# Patient Record
Sex: Female | Born: 1975 | Race: White | Hispanic: No | Marital: Single | Smoking: Current every day smoker
Health system: Southern US, Community
[De-identification: ages and names within clinical notes are randomized; demographics above are authoritative.]

## PROBLEM LIST (undated history)

## (undated) HISTORY — PX: THYROID CYST EXCISION: SHX2511

---

## 2005-11-12 ENCOUNTER — Inpatient Hospital Stay: Payer: Self-pay

## 2005-11-24 ENCOUNTER — Emergency Department: Payer: Self-pay | Admitting: Internal Medicine

## 2006-01-31 ENCOUNTER — Ambulatory Visit: Payer: Self-pay | Admitting: General Surgery

## 2006-12-03 ENCOUNTER — Emergency Department: Payer: Self-pay | Admitting: General Practice

## 2006-12-15 IMAGING — CT CT ABD-PELV W/ CM
2 of 3 series · 15 of 42 positions shown, 19 images · non-contrast
Comparison: none

REASON FOR EXAM: Abdominal pain
COMMENTS:  LMP: One week ago

[Series 2: appendicitis · axial · 0.61mm/px · z∈[+662,+1032]mm · 12 of 140 slices shown, 16 images]
[im 11/140  soft-tissue]
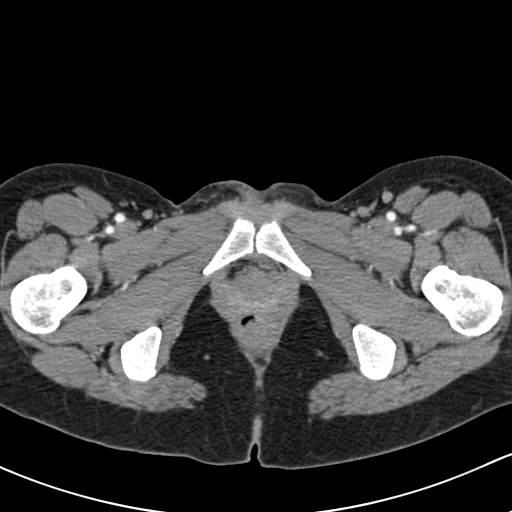
[im 11/140  bone]
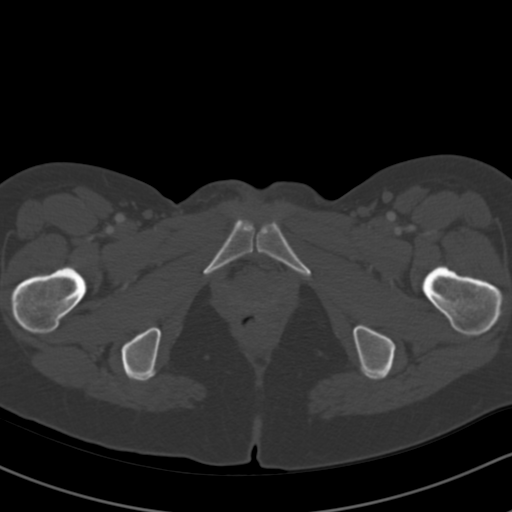
[im 22/140  soft-tissue]
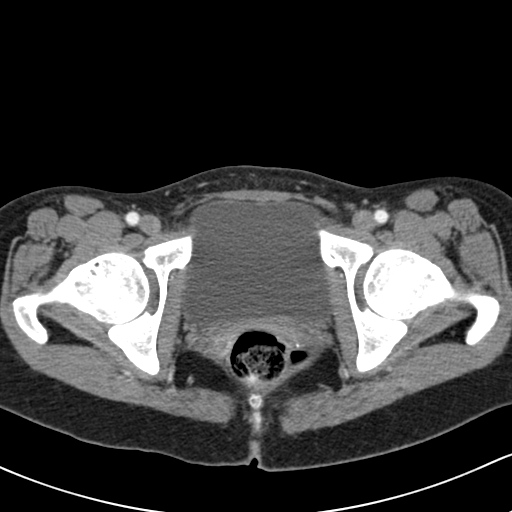
[im 38/140  soft-tissue]
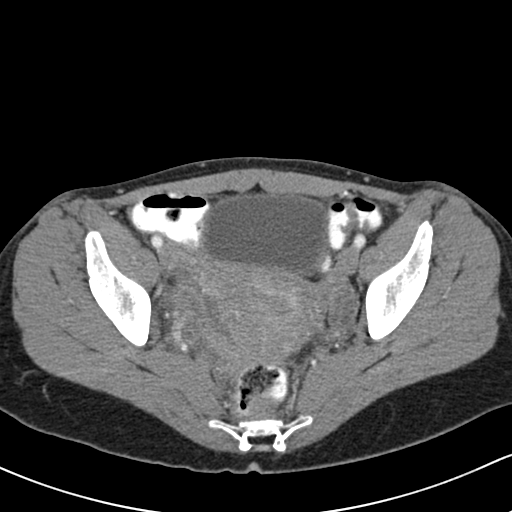
[im 49/140  soft-tissue]
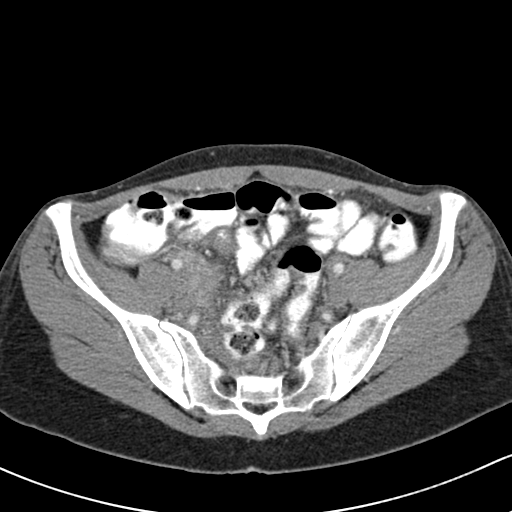
[im 65/140  soft-tissue]
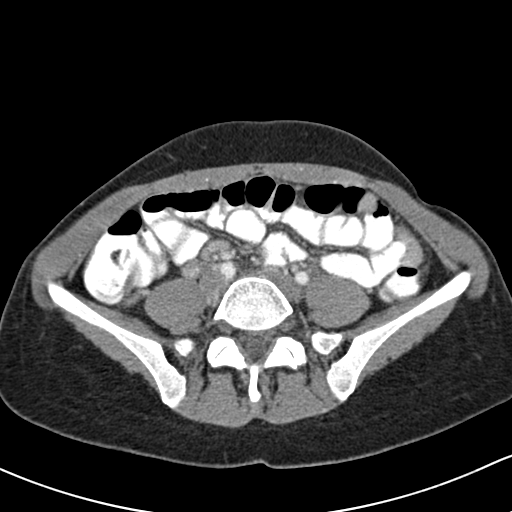
[im 75/140  soft-tissue]
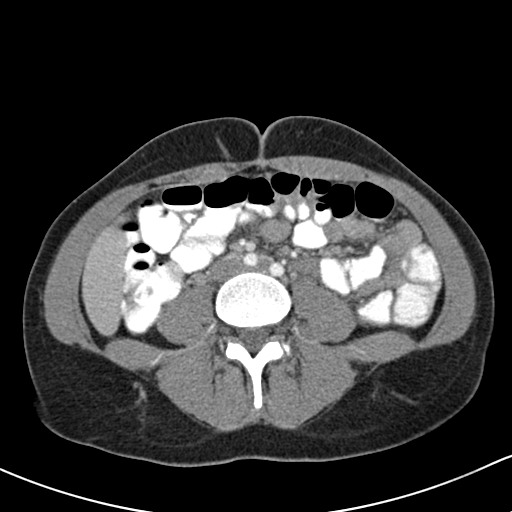
[im 91/140  soft-tissue]
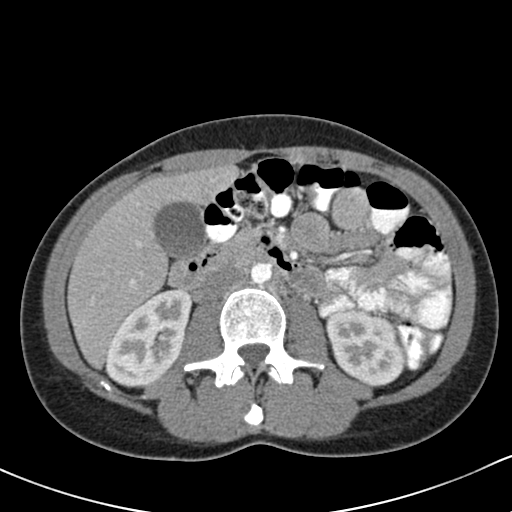
[im 102/140  soft-tissue]
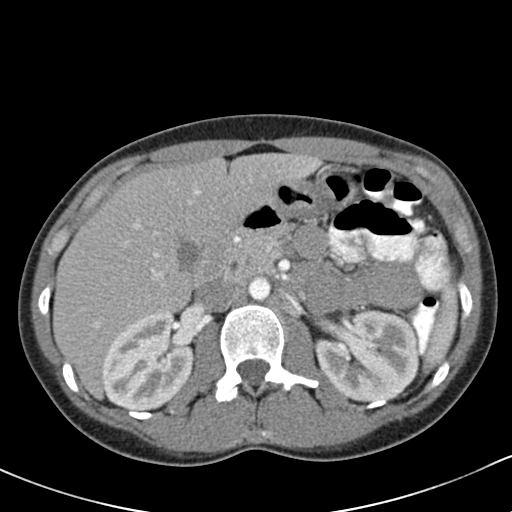
[im 118/140  soft-tissue]
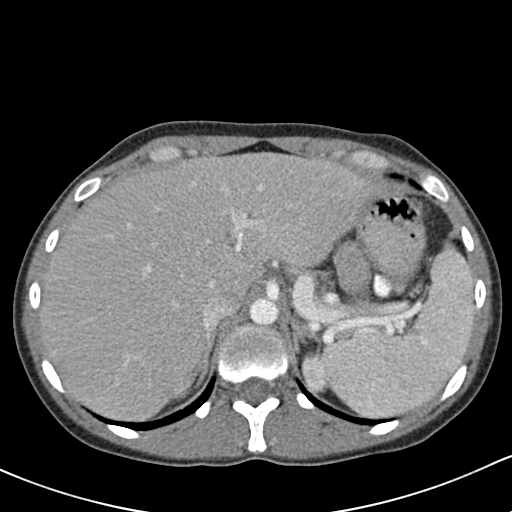
[im 118/140  lung]
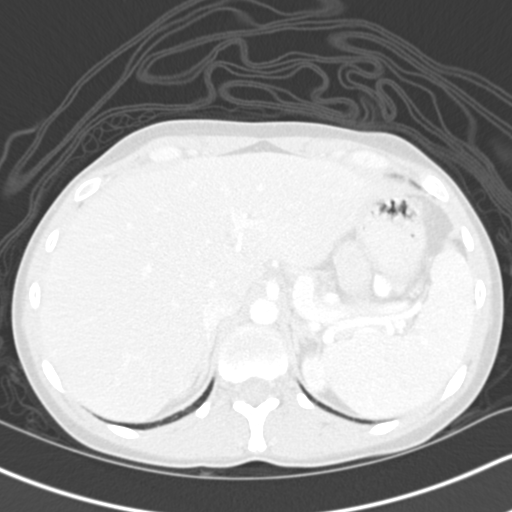
[im 118/140  bone]
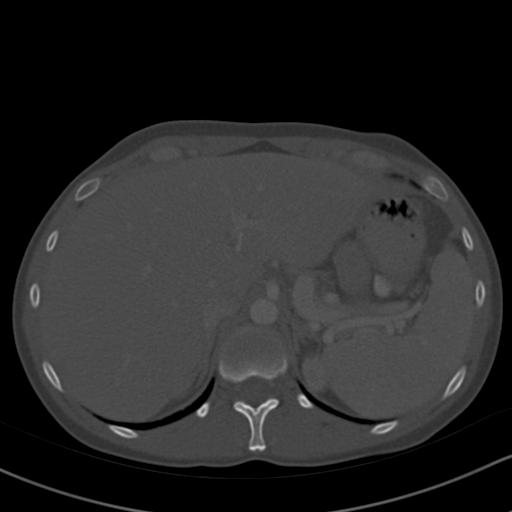
[im 123/140  lung]
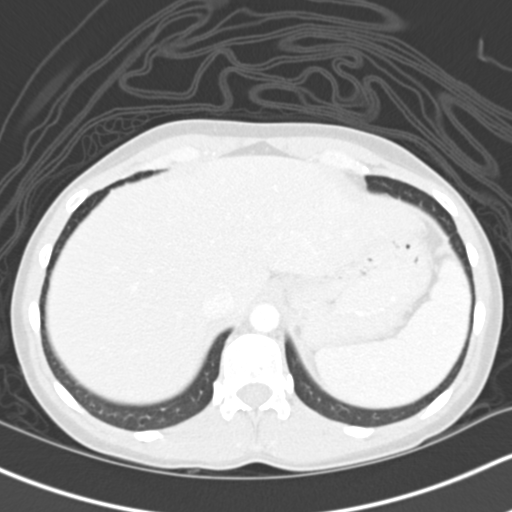
[im 129/140  soft-tissue]
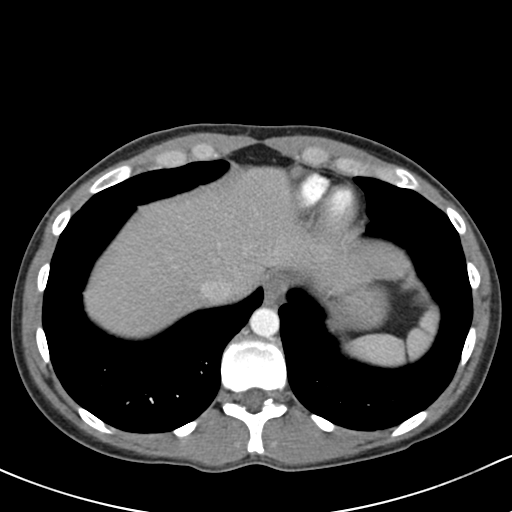
[im 129/140  lung]
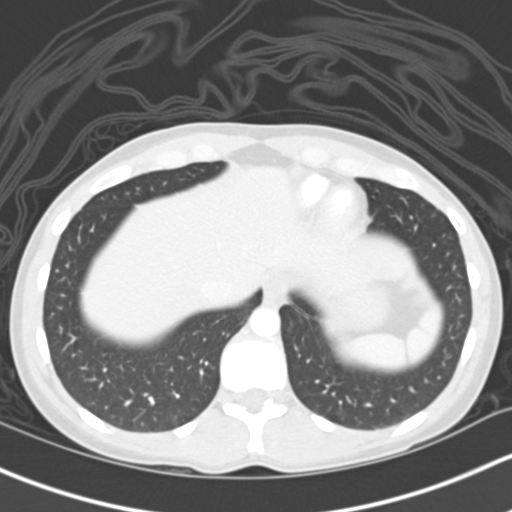
[im 134/140  lung]
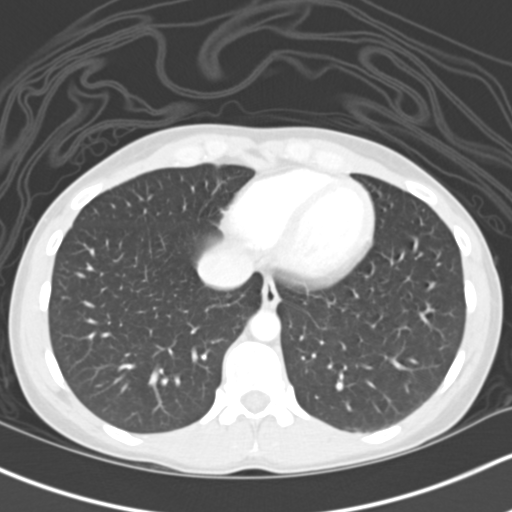

[Series 602: coronals · coronal · 0.84mm/px · 3 of 61 slices shown]
[im 21/61  soft-tissue]
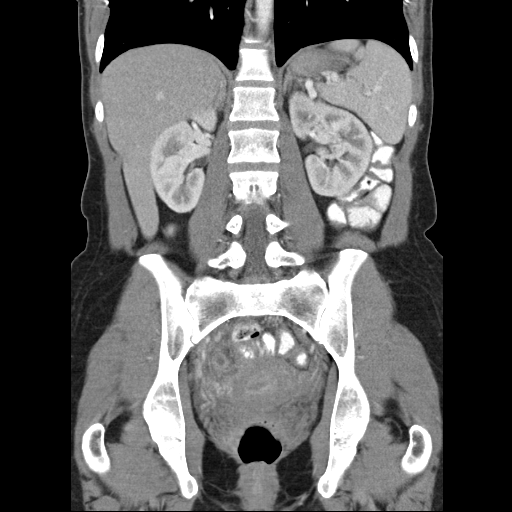
[im 27/61  soft-tissue]
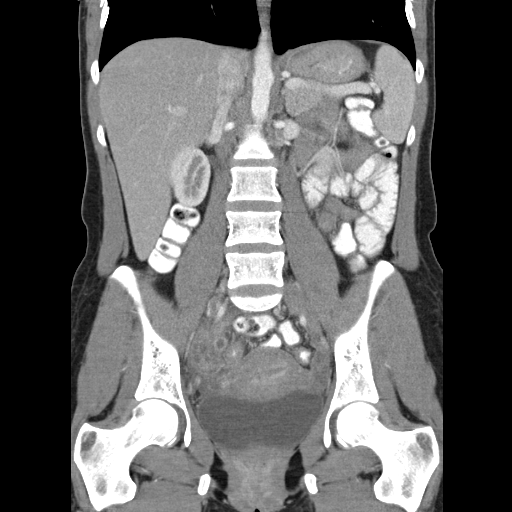
[im 34/61  soft-tissue]
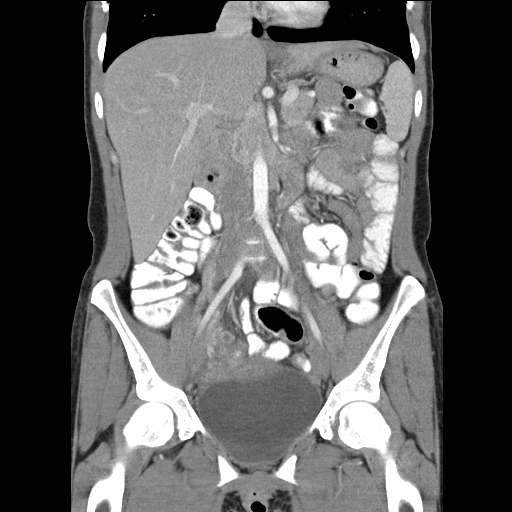

[15 of 42 positions shown; findings below may reference images not displayed]

PROCEDURE:     CT  - CT ABDOMEN / PELVIS  W  - January 31, 2006  [DATE]

RESULT:          An enhanced emergent CT scan was performed for abdominal
pain and RIGHT lower quadrant pain.

The liver and spleen appear intact.  No focal masses are noted.  Both
kidneys excrete the contrast material with no masses seen in either kidney.
No intraabdominal or retroperitoneal masses are seen.  No fluid is noted in
the abdomen or pelvis.

Within the pelvis there is little retroperitoneal fat.  However, it appears
there is thickening of the gallbladder with thickening of the appendix with
infiltration of the surrounding fat.  There is also noted some thickening of
the adjacent terminal ileum wall.  The findings can be compatible with an
appendicitis and should be correlated clinically.
IMPRESSION: Thickening of the appendiceal wall with surrounding
infiltration of a scant amount of fat and thickening of the terminal ileum
wall just adjacent to the appendix.  The findings are compatible with
appendicitis.

The report was called to the emergency room at the conclusion of the
dictation.

## 2007-09-16 ENCOUNTER — Emergency Department: Payer: Self-pay | Admitting: Emergency Medicine

## 2010-09-18 ENCOUNTER — Emergency Department: Payer: Self-pay | Admitting: Emergency Medicine

## 2015-11-03 ENCOUNTER — Emergency Department
Admission: EM | Admit: 2015-11-03 | Discharge: 2015-11-03 | Disposition: A | Discharge status: Home / Self Care | Payer: Self-pay | Attending: Student | Admitting: Student

## 2015-11-03 ENCOUNTER — Encounter: Payer: Self-pay | Admitting: Emergency Medicine

## 2015-11-03 ENCOUNTER — Emergency Department: Payer: Self-pay

## 2015-11-03 DIAGNOSIS — F17210 Nicotine dependence, cigarettes, uncomplicated: Secondary | ICD-10-CM | POA: Insufficient documentation

## 2015-11-03 DIAGNOSIS — O99331 Smoking (tobacco) complicating pregnancy, first trimester: Secondary | ICD-10-CM | POA: Insufficient documentation

## 2015-11-03 DIAGNOSIS — Z3A01 Less than 8 weeks gestation of pregnancy: Secondary | ICD-10-CM | POA: Insufficient documentation

## 2015-11-03 DIAGNOSIS — O039 Complete or unspecified spontaneous abortion without complication: Secondary | ICD-10-CM | POA: Insufficient documentation

## 2015-11-03 LAB — COMPREHENSIVE METABOLIC PANEL
ALT: 20 U/L (ref 14–54)
ANION GAP: 5 (ref 5–15)
AST: 24 U/L (ref 15–41)
Albumin: 3.7 g/dL (ref 3.5–5.0)
Alkaline Phosphatase: 40 U/L (ref 38–126)
BUN: 12 mg/dL (ref 6–20)
CHLORIDE: 109 mmol/L (ref 101–111)
CO2: 23 mmol/L (ref 22–32)
Calcium: 8.8 mg/dL — ABNORMAL LOW (ref 8.9–10.3)
Creatinine, Ser: 0.53 mg/dL (ref 0.44–1.00)
Glucose, Bld: 133 mg/dL — ABNORMAL HIGH (ref 65–99)
POTASSIUM: 3.3 mmol/L — AB (ref 3.5–5.1)
SODIUM: 137 mmol/L (ref 135–145)
Total Bilirubin: 0.4 mg/dL (ref 0.3–1.2)
Total Protein: 6.9 g/dL (ref 6.5–8.1)

## 2015-11-03 LAB — CBC WITH DIFFERENTIAL/PLATELET
Basophils Absolute: 0 10*3/uL (ref 0–0.1)
Basophils Relative: 1 %
EOS ABS: 0.2 10*3/uL (ref 0–0.7)
EOS PCT: 2 %
HCT: 35 % (ref 35.0–47.0)
Hemoglobin: 12.1 g/dL (ref 12.0–16.0)
LYMPHS ABS: 1.6 10*3/uL (ref 1.0–3.6)
Lymphocytes Relative: 18 %
MCH: 31.5 pg (ref 26.0–34.0)
MCHC: 34.6 g/dL (ref 32.0–36.0)
MCV: 91 fL (ref 80.0–100.0)
MONO ABS: 0.5 10*3/uL (ref 0.2–0.9)
MONOS PCT: 6 %
Neutro Abs: 6.6 10*3/uL — ABNORMAL HIGH (ref 1.4–6.5)
Neutrophils Relative %: 73 %
PLATELETS: 206 10*3/uL (ref 150–440)
RBC: 3.84 MIL/uL (ref 3.80–5.20)
RDW: 13.2 % (ref 11.5–14.5)
WBC: 8.9 10*3/uL (ref 3.6–11.0)

## 2015-11-03 LAB — ABO/RH: ABO/RH(D): O POS

## 2015-11-03 LAB — HCG, QUANTITATIVE, PREGNANCY: HCG, BETA CHAIN, QUANT, S: 2 m[IU]/mL (ref <5)

## 2015-11-03 MED ORDER — METHYLERGONOVINE MALEATE 0.2 MG PO TABS
0.2000 mg | ORAL_TABLET | ORAL | Status: AC
Start: 2015-11-03 — End: 2015-11-03
  Administered 2015-11-03: 0.2 mg via ORAL
  Filled 2015-11-03: qty 1

## 2015-11-03 MED ORDER — SODIUM CHLORIDE 0.9 % IV BOLUS (SEPSIS)
500.0000 mL | Freq: Once | INTRAVENOUS | Status: AC
  Administered 2015-11-03: 500 mL via INTRAVENOUS

## 2015-11-03 NOTE — ED Provider Notes (Signed)
Assumed care from Dr. Inocencio HomesGayle. Ultrasound shows an empty uterus was slightly thickened endometrium. No masses or other significant findings. Per Dr. Inocencio HomesGayle the os was slightly open. Patient reports that the bleeding has dramatically slowed and is almost stopped at this point. Vital signs are stable and remained normal. I followed up with Dr. Tiburcio PeaHarris of obstetrics who recommends giving a single oral dose of Methergine now and then having the patient follow up in obstetrics clinic this week.  Olivia CheekPhillip Ciani Rutten, MD 11/03/15 1556

## 2015-11-03 NOTE — ED Notes (Signed)
Patient states she no longer wishes to have the fetal tissue. Patient educated that ED staff would go through proper channels but patient interrupts and states, "No. I don't want it. I want to leave". MD, lab, and charge nurse aware.

## 2015-11-03 NOTE — ED Provider Notes (Signed)
[facility] Emergency Department Provider Note  ____________________________________________  Time seen: Approximately 1:13 PM  I have reviewed the triage vital signs and the nursing notes.   HISTORY  Chief Complaint Vaginal Bleeding and Miscarriage    HPI Olivia French is a 40 y.o. female with history of hepatitis C presents for evaluation of passage of fetal tissue from her vagina which occurred suddenly just prior to arrival, now resolved, no modifying factors, currently moderate. The patient reports that she knew she was pregnant in November. At that time she thought that she had had a miscarriage because she had some heavy vaginal bleeding. She is continued to have some light spotting on and off for the past 2 months. This morning she passed what appeared to be a small fetus. She is still having some vaginal bleeding but she denies any abdominal pain. No nausea, vomiting, diarrhea, fevers or chills. No chest pain or difficulty breathing.   History reviewed. No pertinent past medical history.  There are no active problems to display for this patient.   Past Surgical History  Procedure Laterality Date  . Thyroid cyst excision      No current outpatient prescriptions on file.  Allergies Review of patient's allergies indicates no known allergies.  No family history on file.  Social History Social History  Substance Use Topics  . Smoking status: Current Every Day Smoker -- 0.50 packs/day    Types: Cigarettes  . Smokeless tobacco: None  . Alcohol Use: No    Review of Systems Constitutional: No fever/chills Eyes: No visual changes. ENT: No sore throat. Cardiovascular: Denies chest pain. Respiratory: Denies shortness of breath. Gastrointestinal: No abdominal pain.  No nausea, no vomiting.  No diarrhea.  No constipation. Genitourinary: Negative for dysuria. Musculoskeletal: Negative for back pain. Skin: Negative for rash. Neurological:  Negative for headaches, focal weakness or numbness.  10-point ROS otherwise negative.  ____________________________________________   PHYSICAL EXAM:  VITAL SIGNS: ED Triage Vitals  Enc Vitals Group     BP 11/03/15 1304 140/60 mmHg     Pulse Rate 11/03/15 1304 88     Resp 11/03/15 1301 18     Temp 11/03/15 1304 97.9 F (36.6 C)     Temp Source 11/03/15 1304 Oral     SpO2 11/03/15 1304 98 %     Weight 11/03/15 1301 115 lb (52.164 kg)     Height 11/03/15 1301 5\' 5"  (1.651 m)     Head Cir --      Peak Flow --      Pain Score --      Pain Loc --      Pain Edu? --      Excl. in GC? --     Constitutional: Alert and oriented. Well appearing and in no acute distress. Eyes: Conjunctivae are normal. PERRL. EOMI. Head: Atraumatic. Nose: No congestion/rhinnorhea. Mouth/Throat: Mucous membranes are moist.  Oropharynx non-erythematous. Neck: No stridor.   Cardiovascular: Normal rate, regular rhythm. Grossly normal heart sounds.  Good peripheral circulation. Respiratory: Normal respiratory effort.  No retractions. Lungs CTAB. Gastrointestinal: Soft and nontender. No distention.  No CVA tenderness. Pelvic: Mild amount of dark blood from an open os Musculoskeletal: No lower extremity tenderness nor edema.  No joint effusions. Neurologic:  Normal speech and language. No gross focal neurologic deficits are appreciated. No gait instability. Skin:  Skin is warm, dry and intact. No rash noted. Psychiatric: Mood and affect are normal. Speech and behavior are normal.  ____________________________________________  LABS (all labs ordered are listed, but only abnormal results are displayed)  Labs Reviewed  CBC WITH DIFFERENTIAL/PLATELET - Abnormal; Notable for the following:    Neutro Abs 6.6 (*)    All other components within normal limits  COMPREHENSIVE METABOLIC PANEL - Abnormal; Notable for the following:    Potassium 3.3 (*)    Glucose, Bld 133 (*)    Calcium 8.8 (*)    All other  components within normal limits  HCG, QUANTITATIVE, PREGNANCY  ABO/RH   ____________________________________________  EKG  none ____________________________________________  RADIOLOGY  Transvaginal Ultrasound -pending ____________________________________________   PROCEDURES  Procedure(s) performed: None  Critical Care performed: No  ____________________________________________   INITIAL IMPRESSION / ASSESSMENT AND PLAN / ED COURSE  Pertinent labs & imaging results that were available during my care of the patient were reviewed by me and considered in my medical decision making (see chart for details).  Olivia French is a 40 y.o. female with history of hepatitis C presents for evaluation of passage of fetal tissue from her vagina as well as some mild to moderate vaginal bleeding today. On exam, she is nontoxic appearing and in no acute distress. Vital signs are stable, she is afebrile. She has a benign abdominal exam. There is a mild to moderate amount of bleeding coming from an open os. She did present with a 4-5 cm object in hand which appeared to be a tiny fetus. We will send it to pathology for further evaluation. She has no idea how far along she might have been in this pregnancy. Awaiting basic labs, Rh type, hCG and transvaginal ultrasound. Will discuss case with OB/GYN.   ----------------------------------------- 3:01 PM on 11/03/2015 ----------------------------------------- CBC and CMP are generally unremarkable. O+ blood type, no RhoGAM needed. HCG is 2. Ultrasound pending after which Dr. Scotty CourtStafford will reassess the patient's bleeding and discuss the case with Dr. Tiburcio PeaHarris of OB/GYN. Care transferred to Dr. Scotty CourtStafford at this time.  ____________________________________________   FINAL CLINICAL IMPRESSION(S) / ED DIAGNOSES  Final diagnoses:  Spontaneous miscarriage      [name], MD 11/03/15 [phone]

## 2015-11-03 NOTE — ED Notes (Signed)
Lab called back and stated that they found the correct form and will tube it to us.

## 2015-11-03 NOTE — ED Notes (Signed)
Patient states she wants to have the fetal tissue to take home. Charge nurse, lab, and MD notified.

## 2015-11-03 NOTE — ED Notes (Signed)
Pt states she has been passing blood clots since November, did not know she was pregnant, and miscarried this am, brought in fetal tissue. Does admit to cocaine use. Has been cramping this am, however pain free at this time. Has been bleeding all morning vaginally.

## 2015-11-03 NOTE — ED Notes (Addendum)
Fetal tissue stored in sterile container, labeled and hand carried to lab. Was told by lab staff Morrie Sheldon(Ashley) that she would put product in cytology lab and send e-mail to inform lab personnel that it was there.

## 2015-11-03 NOTE — ED Notes (Signed)
Dr. Inocencio HomesGayle at bedside for pelvic exam.

## 2015-11-03 NOTE — Discharge Instructions (Signed)
Miscarriage  A miscarriage is the sudden loss of an unborn baby (fetus) before the 20th week of pregnancy. Most miscarriages happen in the first 3 months of pregnancy. Sometimes, it happens before a woman even knows she is pregnant. A miscarriage is also called a "spontaneous miscarriage" or "early pregnancy loss." Having a miscarriage can be an emotional experience. Talk with your caregiver about any questions you may have about miscarrying, the grieving process, and your future pregnancy plans.  CAUSES    Problems with the fetal chromosomes that make it impossible for the baby to develop normally. Problems with the baby's genes or chromosomes are most often the result of errors that occur, by chance, as the embryo divides and grows. The problems are not inherited from the parents.   Infection of the cervix or uterus.    Hormone problems.    Problems with the cervix, such as having an incompetent cervix. This is when the tissue in the cervix is not strong enough to hold the pregnancy.    Problems with the uterus, such as an abnormally shaped uterus, uterine fibroids, or congenital abnormalities.    Certain medical conditions.    Smoking, drinking alcohol, or taking illegal drugs.    Trauma.   Often, the cause of a miscarriage is unknown.   SYMPTOMS    Vaginal bleeding or spotting, with or without cramps or pain.   Pain or cramping in the abdomen or lower back.   Passing fluid, tissue, or blood clots from the vagina.  DIAGNOSIS   Your caregiver will perform a physical exam. You may also have an ultrasound to confirm the miscarriage. Blood or urine tests may also be ordered.  TREATMENT    Sometimes, treatment is not necessary if you naturally pass all the fetal tissue that was in the uterus. If some of the fetus or placenta remains in the body (incomplete miscarriage), tissue left behind may become infected and must be removed. Usually, a dilation and curettage (D and C) procedure is performed.  During a D and C procedure, the cervix is widened (dilated) and any remaining fetal or placental tissue is gently removed from the uterus.   Antibiotic medicines are prescribed if there is an infection. Other medicines may be given to reduce the size of the uterus (contract) if there is a lot of bleeding.   If you have Rh negative blood and your baby was Rh positive, you will need a Rh immunoglobulin shot. This shot will protect any future baby from having Rh blood problems in future pregnancies.  HOME CARE INSTRUCTIONS    Your caregiver may order bed rest or may allow you to continue light activity. Resume activity as directed by your caregiver.   Have someone help with home and family responsibilities during this time.    Keep track of the number of sanitary pads you use each day and how soaked (saturated) they are. Write down this information.    Do not use tampons. Do not douche or have sexual intercourse until approved by your caregiver.    Only take over-the-counter or prescription medicines for pain or discomfort as directed by your caregiver.    Do not take aspirin. Aspirin can cause bleeding.    Keep all follow-up appointments with your caregiver.    If you or your partner have problems with grieving, talk to your caregiver or seek counseling to help cope with the pregnancy loss. Allow enough time to grieve before trying to get pregnant again.     SEEK IMMEDIATE MEDICAL CARE IF:    You have severe cramps or pain in your back or abdomen.   You have a fever.   You pass large blood clots (walnut-sized or larger) ortissue from your vagina. Save any tissue for your caregiver to inspect.    Your bleeding increases.    You have a thick, bad-smelling vaginal discharge.   You become lightheaded, weak, or you faint.    You have chills.   MAKE SURE YOU:   Understand these instructions.   Will watch your condition.   Will get help right away if you are not doing well or get worse.     This  information is not intended to replace advice given to you by your health care provider. Make sure you discuss any questions you have with your health care provider.     Document Released: 04/08/2001 Document Revised: 02/07/2013 Document Reviewed: 12/02/2011  Elsevier Interactive Patient Education 2016 [employer]

## 2015-11-03 NOTE — ED Notes (Signed)
Pharmacy called and notified of need for methergine tablet. States they will sent it.

## 2015-11-03 NOTE — ED Notes (Signed)
Lab called and notified this RN that the product of conception form was the incorrect form for the fetal tissue. Lab states they need a cyto-genetics form. Gearldine BienenstockBrandy, Press photographercharge nurse, and secretaries notified. Unable to identify form in ED. Lab states they will look around and call this RN back for further instructions.

## 2015-11-03 NOTE — ED Notes (Addendum)
Patient transported to US 

## 2017-07-09 IMAGING — US US OB COMP LESS 14 WK
1 series · 13 of 28 positions shown · non-contrast
Comparison: None.

CLINICAL DATA: Patient with vaginal bleeding.

EXAM:
OBSTETRIC <14 WK US AND TRANSVAGINAL OB US
TECHNIQUE: Both transabdominal and transvaginal ultrasound examinations were
performed for complete evaluation of the gestation as well as the
maternal uterus, adnexal regions, and pelvic cul-de-sac.
Transvaginal technique was performed to assess early pregnancy.

[Series 1: us ob comp less 14 wk · 0.19mm/px · 13 of 70 slices shown]
[im 3/70]
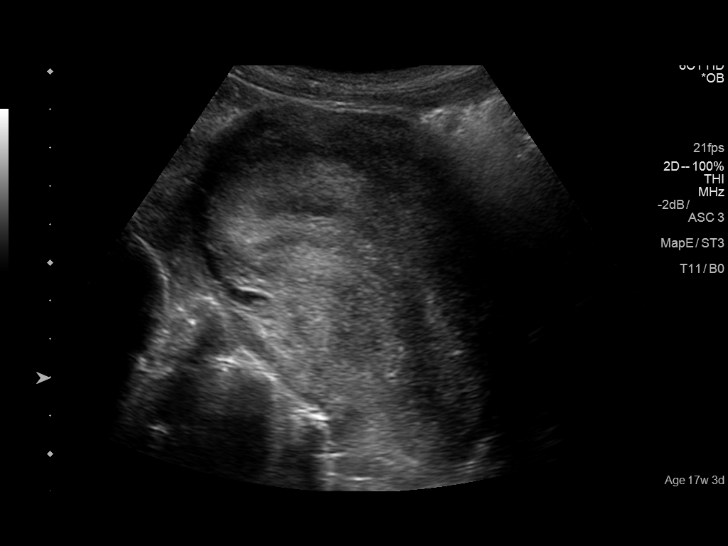
[im 8/70]
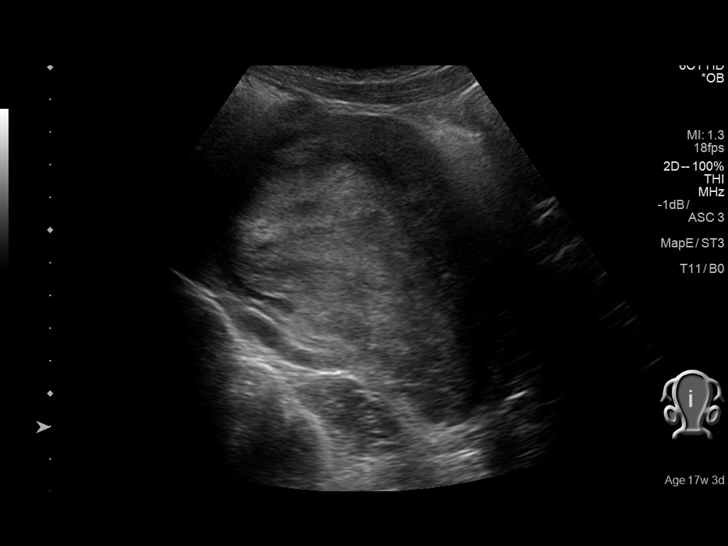
[im 13/70]
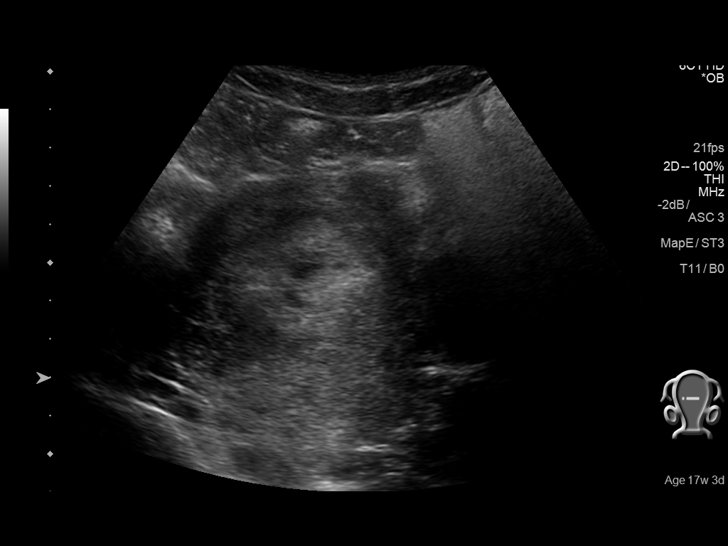
[im 18/70]
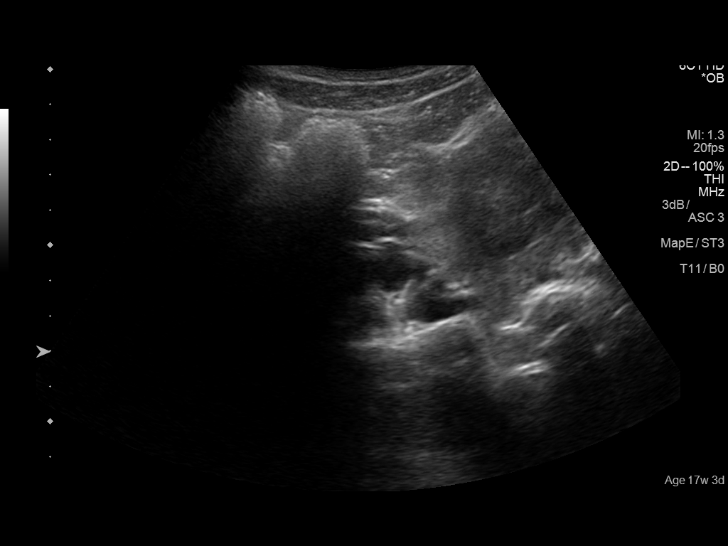
[im 24/70]
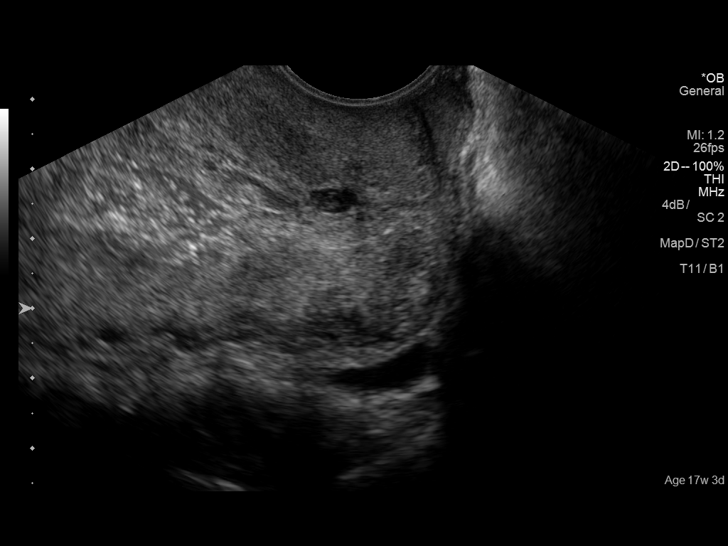
[im 29/70]
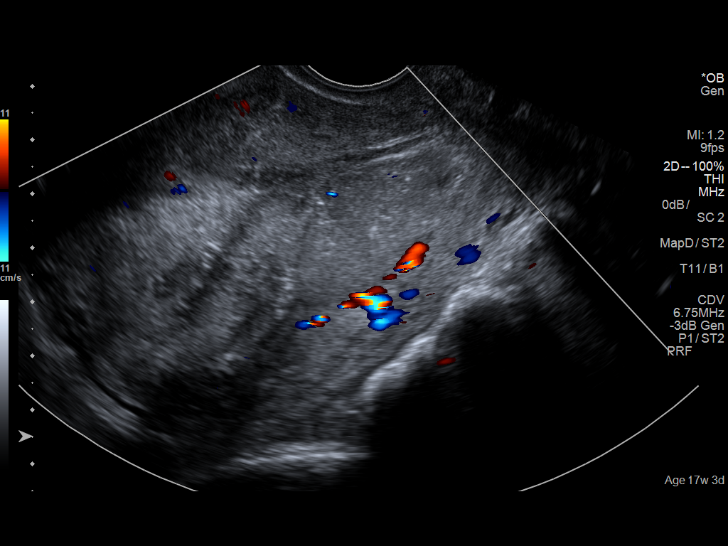
[im 36/70]
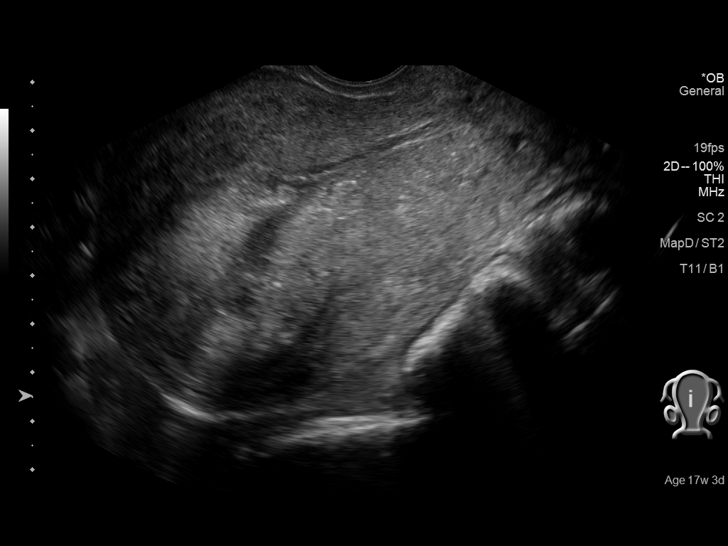
[im 41/70]
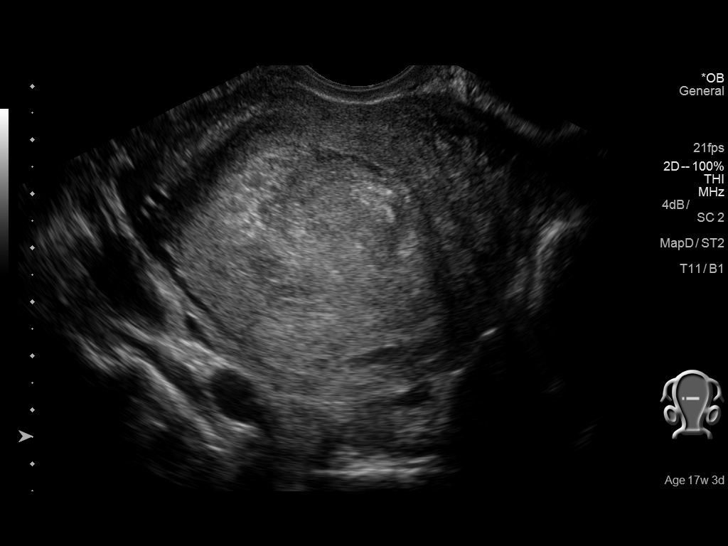
[im 47/70]
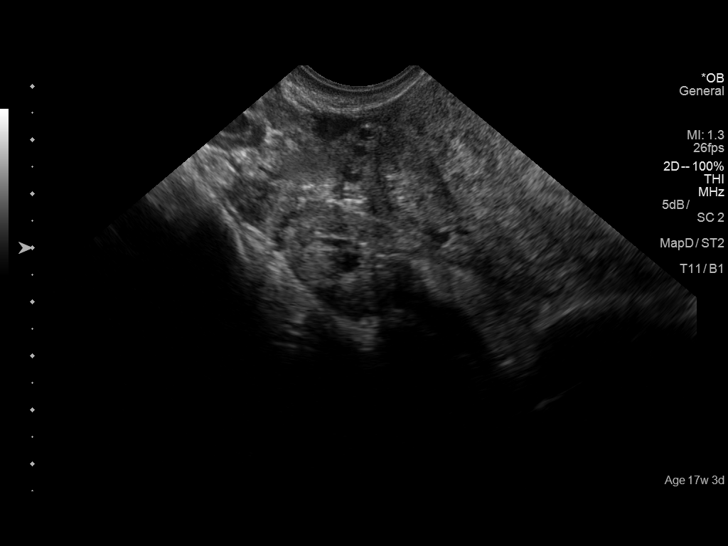
[im 52/70]
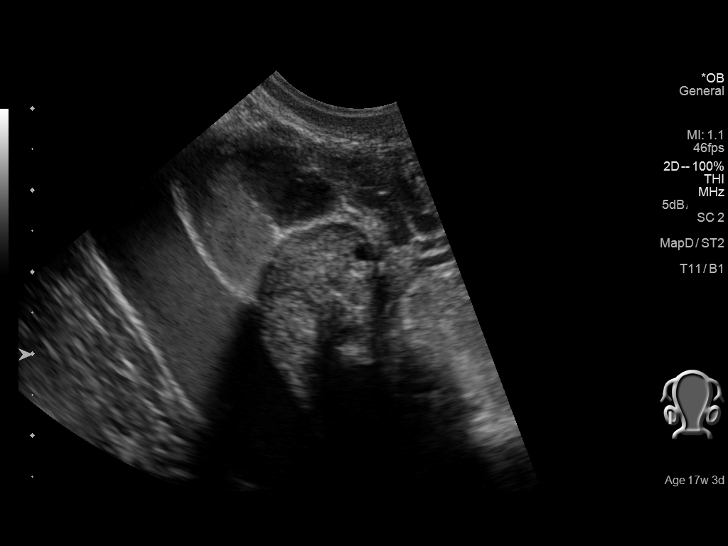
[im 57/70]
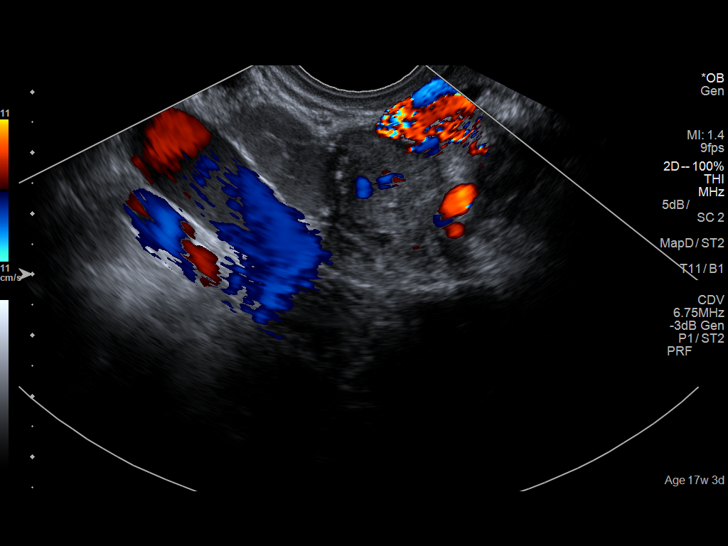
[im 62/70]
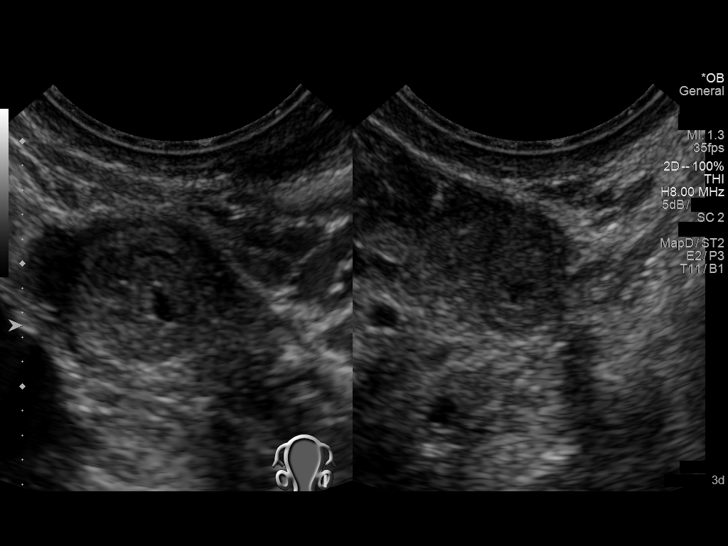
[im 67/70]
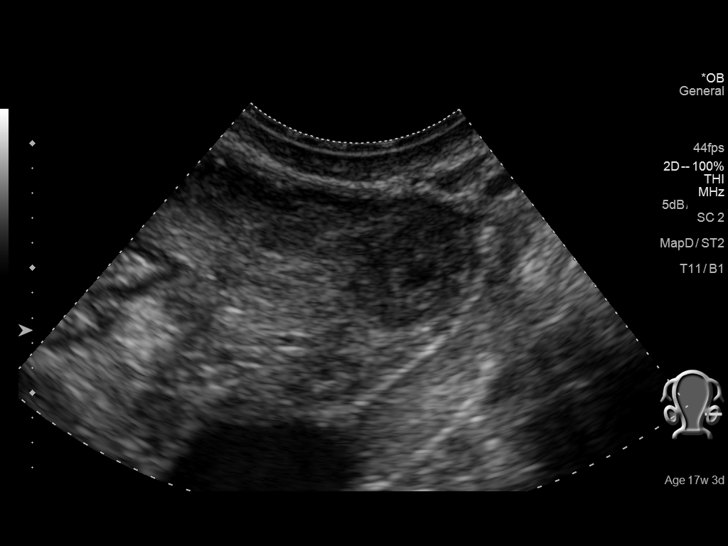

[13 of 28 positions shown; findings below may reference images not displayed]

FINDINGS: Intrauterine gestational sac: Not present

Yolk sac:  Not present

Embryo:  Not present

Cardiac Activity: Not present

Maternal uterus/adnexae: The endometrium is thickened and
heterogeneous in echogenicity. The right and left ovaries are
unremarkable. No adnexal masses are identified. Probable corpus
luteum within the left ovary. No significant free fluid in the
pelvis.
IMPRESSION: No intrauterine gestation identified. The endometrium is thickened
and heterogeneous in echogenicity. In the setting of positive
pregnancy test and no definite intrauterine pregnancy, this reflects
a pregnancy of unknown location. Differential considerations include
early normal IUP, abnormal IUP, or nonvisualized ectopic pregnancy.
Differentiation is achieved with serial beta HCG supplemented by
repeat sonography as clinically warranted.

## 2024-03-26 ENCOUNTER — Other Ambulatory Visit: Payer: Self-pay

## 2024-03-26 ENCOUNTER — Emergency Department
Admission: EM | Admit: 2024-03-26 | Discharge: 2024-03-26 | Disposition: A | Discharge status: Home / Self Care | Attending: Emergency Medicine | Admitting: Emergency Medicine

## 2024-03-26 DIAGNOSIS — W57XXXA Bitten or stung by nonvenomous insect and other nonvenomous arthropods, initial encounter: Secondary | ICD-10-CM | POA: Insufficient documentation

## 2024-03-26 DIAGNOSIS — S80261A Insect bite (nonvenomous), right knee, initial encounter: Secondary | ICD-10-CM | POA: Insufficient documentation

## 2024-03-26 MED ORDER — AMOXICILLIN-POT CLAVULANATE 875-125 MG PO TABS
1.0000 | ORAL_TABLET | Freq: Once | ORAL | Status: AC
  Administered 2024-03-26: 1 via ORAL
  Filled 2024-03-26: qty 1

## 2024-03-26 MED ORDER — DOXYCYCLINE HYCLATE 100 MG PO TABS
100.0000 mg | ORAL_TABLET | Freq: Once | ORAL | Status: AC
  Administered 2024-03-26: 100 mg via ORAL
  Filled 2024-03-26: qty 1

## 2024-03-26 MED ORDER — HYDROCODONE-ACETAMINOPHEN 5-325 MG PO TABS
1.0000 | ORAL_TABLET | Freq: Once | ORAL | Status: AC
  Administered 2024-03-26: 1 via ORAL
  Filled 2024-03-26: qty 1

## 2024-03-26 MED ORDER — DOXYCYCLINE HYCLATE 50 MG PO CAPS: 100.0000 mg | ORAL_CAPSULE | Freq: Two times a day (BID) | ORAL | 0 refills | Status: AC

## 2024-03-26 MED ORDER — AMOXICILLIN-POT CLAVULANATE 875-125 MG PO TABS: 1.0000 | ORAL_TABLET | Freq: Two times a day (BID) | ORAL | 0 refills | Status: AC

## 2024-03-26 MED ORDER — IBUPROFEN 600 MG PO TABS
600.0000 mg | ORAL_TABLET | Freq: Once | ORAL | Status: AC
  Administered 2024-03-26: 600 mg via ORAL
  Filled 2024-03-26: qty 1

## 2024-03-26 MED ORDER — HYDROCODONE-ACETAMINOPHEN 5-325 MG PO TABS: 1.0000 | ORAL_TABLET | Freq: Four times a day (QID) | ORAL | 0 refills | Status: AC | PRN

## 2024-03-26 NOTE — Discharge Instructions (Signed)
 Take and finish both antibiotics as prescribed.  You may take ibuprofen as needed for pain, Norco as needed for more severe pain.  Apply moist compresses to affected area several times daily.  Return to the ER for worsening symptoms, increased redness/swelling, fever or other concerns.

## 2024-03-26 NOTE — ED Triage Notes (Signed)
 Pt to ED via POV c/o spider bite to right knee. Pt reports this happened about 2 weeks ago. Pt has bite to right knee with redness and swelling. Hurts more when putting weight on that leg.

## 2024-03-26 NOTE — ED Provider Notes (Signed)
 [facility] Provider Note    Event Date/Time   First MD Initiated Contact with Patient 03/26/24 [phone]     (approximate)   History   Insect Bite   HPI  Olivia French is a 48 y.o. female who presents to the ED from home with a chief complaint of spider bite.  Patient believes she was bitten by a brown recluse spider to her right knee 2 weeks ago.  Presents tonight with pain, redness and swelling.  Shows me pictures which demonstrate increased swelling previously.  States swelling is overall better.  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  Has not yet sought medical evaluation.  She was off from work today and was able to put her feet up which helped with the redness and swelling.  Presents tonight because she finally had a day off and decided to seek medical evaluation.  Denies associated fever/chills, chest pain, shortness of breath, abdominal pain, nausea, vomiting or dizziness.     Past Medical History  History reviewed. No pertinent past medical history. No history of diabetes  Active Problem List  There are no active problems to display for this patient.    Past Surgical History   Past Surgical History:  Procedure Laterality Date   THYROID CYST EXCISION       Home Medications   Prior to Admission medications   Medication Sig Start Date End Date Taking? Authorizing Provider  amoxicillin-clavulanate (AUGMENTIN) 875-125 MG tablet Take 1 tablet by mouth 2 (two) times daily. 03/26/24  Yes Princes Finger J, MD  doxycycline (VIBRAMYCIN) 50 MG capsule Take 2 capsules (100 mg total) by mouth 2 (two) times daily. 03/26/24  Yes Rockell Faulks J, MD  HYDROcodone-acetaminophen (NORCO/VICODIN) 5-325 MG tablet Take 1 tablet by mouth every 6 (six) hours as needed for moderate pain (pain score 4-6). 03/26/24  Yes Trevor Duty J, MD     Allergies  Patient has no known allergies.   Family History  History reviewed. No pertinent family  history.   Physical Exam  Triage Vital Signs: ED Triage Vitals  Encounter Vitals Group     BP 03/26/24 0245 112/74     Systolic BP Percentile --      Diastolic BP Percentile --      Pulse Rate 03/26/24 0245 91     Resp 03/26/24 0245 18     Temp 03/26/24 0245 97.7 F (36.5 C)     Temp Source 03/26/24 0245 Oral     SpO2 03/26/24 0245 100 %     Weight --      Height --      Head Circumference --      Peak Flow --      Pain Score 03/26/24 0241 9     Pain Loc --      Pain Education --      Exclude from Growth Chart --     Updated Vital Signs: BP 112/74 (BP Location: Left Arm)   Pulse 91   Temp 97.7 F (36.5 C) (Oral)   Resp 18   SpO2 100%    General: Awake, no distress.  CV:  Good peripheral perfusion.  Resp:  Normal effort.  Abd:  No distention.  Other:  Right knee: Small area of warmth and erythema to anterior knee with central ulceration.  No fluctuance.  Able to range knee with some pain.  2+ distal pulses.   ED Results / Procedures / Treatments  Labs (all labs ordered are listed, but only abnormal results are displayed) Labs Reviewed - No data to display   EKG  None   RADIOLOGY None   Official radiology report(s): No results found.   PROCEDURES:  Critical Care performed: No  Procedures   MEDICATIONS ORDERED IN ED: Medications  amoxicillin-clavulanate (AUGMENTIN) 875-125 MG per tablet 1 tablet (has no administration in time range)  doxycycline (VIBRA-TABS) tablet 100 mg (has no administration in time range)  ibuprofen (ADVIL) tablet 600 mg (has no administration in time range)  HYDROcodone-acetaminophen (NORCO/VICODIN) 5-325 MG per tablet 1 tablet (has no administration in time range)     IMPRESSION / MDM / ASSESSMENT AND PLAN / ED COURSE  I reviewed the triage vital signs and the nursing notes.                             48 year old female who presents with redness and swelling to her right knee from possible spider bite.  Differential  diagnosis includes but is not limited to cellulitis, abscess, septic joint, etc.  I personally reviewed patient's records and notes a GYN office visit from 2015 for IUD surveillance.  Patient's presentation is most consistent with acute, uncomplicated illness.  Well-appearing patient presenting with a 2-week history of insect/probable spider bite with improved swelling.  No constitutional symptoms.  No clinical evidence for septic joint or sepsis.  Will double cover with Augmentin plus Doxycycline, administer Ibuprofen and Norco for pain.  She will follow-up closely with her PCP.  Strict return precautions given.  Patient verbalizes understanding and agrees with plan of care.    FINAL CLINICAL IMPRESSION(S) / ED DIAGNOSES   Final diagnoses:  Bug bite with infection, initial encounter     Rx / DC Orders   ED Discharge Orders          Ordered    amoxicillin-clavulanate (AUGMENTIN) 875-125 MG tablet  2 times daily        03/26/24 0259    doxycycline (VIBRAMYCIN) 50 MG capsule  2 times daily        03/26/24 0259    HYDROcodone-acetaminophen (NORCO/VICODIN) 5-325 MG tablet  Every 6 hours PRN        03/26/24 0259             Note:  This document was prepared using Dragon voice recognition software and may include unintentional dictation errors.   Azar South J, MD 03/26/24 [phone]
